# Patient Record
Sex: Male | Born: 1960 | Race: Black or African American | Hispanic: No | Marital: Married | Smoking: Never smoker
Health system: Southern US, Community
[De-identification: ages and names within clinical notes are randomized; demographics above are authoritative.]

---

## 2013-01-13 ENCOUNTER — Encounter: Payer: Self-pay | Admitting: Rheumatology

## 2013-02-04 ENCOUNTER — Encounter: Payer: Self-pay | Admitting: Rheumatology

## 2014-12-14 ENCOUNTER — Emergency Department: Payer: Self-pay | Admitting: Emergency Medicine

## 2014-12-14 LAB — COMPREHENSIVE METABOLIC PANEL
ALK PHOS: 76 U/L
ALT: 37 U/L
AST: 25 U/L (ref 15–37)
Albumin: 3.8 g/dL (ref 3.4–5.0)
Anion Gap: 4 — ABNORMAL LOW (ref 7–16)
BUN: 11 mg/dL (ref 7–18)
Bilirubin,Total: 0.7 mg/dL (ref 0.2–1.0)
CHLORIDE: 105 mmol/L (ref 98–107)
CO2: 31 mmol/L (ref 21–32)
Calcium, Total: 8.5 mg/dL (ref 8.5–10.1)
Creatinine: 1.2 mg/dL (ref 0.60–1.30)
Glucose: 100 mg/dL — ABNORMAL HIGH (ref 65–99)
Osmolality: 279 (ref 275–301)
Potassium: 3.5 mmol/L (ref 3.5–5.1)
Sodium: 140 mmol/L (ref 136–145)
Total Protein: 8 g/dL (ref 6.4–8.2)

## 2014-12-14 LAB — CBC WITH DIFFERENTIAL/PLATELET
BASOS ABS: 0 10*3/uL (ref 0.0–0.1)
Basophil %: 0.3 %
EOS ABS: 0.1 10*3/uL (ref 0.0–0.7)
Eosinophil %: 0.7 %
HCT: 40.5 % (ref 40.0–52.0)
HGB: 13.6 g/dL (ref 13.0–18.0)
LYMPHS PCT: 32.1 %
Lymphocyte #: 2.6 10*3/uL (ref 1.0–3.6)
MCH: 32.9 pg (ref 26.0–34.0)
MCHC: 33.6 g/dL (ref 32.0–36.0)
MCV: 98 fL (ref 80–100)
MONOS PCT: 8.2 %
Monocyte #: 0.7 x10 3/mm (ref 0.2–1.0)
Neutrophil #: 4.7 10*3/uL (ref 1.4–6.5)
Neutrophil %: 58.7 %
Platelet: 260 10*3/uL (ref 150–440)
RBC: 4.14 10*6/uL — ABNORMAL LOW (ref 4.40–5.90)
RDW: 12.5 % (ref 11.5–14.5)
WBC: 8 10*3/uL (ref 3.8–10.6)

## 2014-12-14 LAB — URINALYSIS, COMPLETE
BACTERIA: NONE SEEN
BLOOD: NEGATIVE
Bilirubin,UR: NEGATIVE
Glucose,UR: NEGATIVE mg/dL (ref 0–75)
Ketone: NEGATIVE
Leukocyte Esterase: NEGATIVE
NITRITE: NEGATIVE
PH: 5 (ref 4.5–8.0)
Protein: NEGATIVE
RBC,UR: 1 /HPF (ref 0–5)
SPECIFIC GRAVITY: 1.016 (ref 1.003–1.030)
SQUAMOUS EPITHELIAL: NONE SEEN

## 2014-12-14 LAB — LIPASE, BLOOD: LIPASE: 182 U/L (ref 73–393)

## 2014-12-14 LAB — TROPONIN I

## 2015-06-13 ENCOUNTER — Encounter
Admission: RE | Disposition: A | Discharge status: Home / Self Care | Payer: Self-pay | Source: Ambulatory Visit | Attending: Gastroenterology

## 2015-06-13 ENCOUNTER — Ambulatory Visit: Payer: Federal, State, Local not specified - PPO | Admitting: Anesthesiology

## 2015-06-13 ENCOUNTER — Ambulatory Visit
Admission: RE | Admit: 2015-06-13 | Discharge: 2015-06-13 | Disposition: A | Discharge status: Home / Self Care | Payer: Federal, State, Local not specified - PPO | Source: Ambulatory Visit | Attending: Gastroenterology | Admitting: Gastroenterology

## 2015-06-13 DIAGNOSIS — Z79899 Other long term (current) drug therapy: Secondary | ICD-10-CM | POA: Insufficient documentation

## 2015-06-13 DIAGNOSIS — M549 Dorsalgia, unspecified: Secondary | ICD-10-CM | POA: Insufficient documentation

## 2015-06-13 DIAGNOSIS — I252 Old myocardial infarction: Secondary | ICD-10-CM | POA: Insufficient documentation

## 2015-06-13 DIAGNOSIS — G8929 Other chronic pain: Secondary | ICD-10-CM | POA: Diagnosis not present

## 2015-06-13 DIAGNOSIS — D125 Benign neoplasm of sigmoid colon: Secondary | ICD-10-CM | POA: Diagnosis not present

## 2015-06-13 DIAGNOSIS — K621 Rectal polyp: Secondary | ICD-10-CM | POA: Insufficient documentation

## 2015-06-13 DIAGNOSIS — Z1211 Encounter for screening for malignant neoplasm of colon: Secondary | ICD-10-CM | POA: Diagnosis present

## 2015-06-13 DIAGNOSIS — M778 Other enthesopathies, not elsewhere classified: Secondary | ICD-10-CM | POA: Insufficient documentation

## 2015-06-13 HISTORY — PX: COLONOSCOPY WITH PROPOFOL: SHX5780

## 2015-06-13 SURGERY — COLONOSCOPY WITH PROPOFOL
Anesthesia: General

## 2015-06-13 MED ORDER — LIDOCAINE HCL (CARDIAC) 20 MG/ML IV SOLN
INTRAVENOUS | Status: DC | PRN
  Administered 2015-06-13: 50 mg via INTRAVENOUS

## 2015-06-13 MED ORDER — PROPOFOL 10 MG/ML IV BOLUS
INTRAVENOUS | Status: DC | PRN
  Administered 2015-06-13: 70 mg via INTRAVENOUS
  Administered 2015-06-13: 30 mg via INTRAVENOUS

## 2015-06-13 MED ORDER — MIDAZOLAM HCL 5 MG/5ML IJ SOLN
INTRAMUSCULAR | Status: DC | PRN
Start: 2015-06-13 — End: 2015-06-13
  Administered 2015-06-13: 1 mg via INTRAVENOUS

## 2015-06-13 MED ORDER — SODIUM CHLORIDE 0.9 % IV SOLN: INTRAVENOUS | Status: DC

## 2015-06-13 MED ORDER — SODIUM CHLORIDE 0.9 % IV SOLN
INTRAVENOUS | Status: DC
  Administered 2015-06-13: 1000 mL via INTRAVENOUS

## 2015-06-13 MED ORDER — PROPOFOL INFUSION 10 MG/ML OPTIME
INTRAVENOUS | Status: DC | PRN
  Administered 2015-06-13: 120 ug/kg/min via INTRAVENOUS

## 2015-06-13 NOTE — Discharge Instructions (Signed)

## 2015-06-13 NOTE — H&P (Signed)
  Primary Care Physician:  Idelle Crouch, MD  Pre-Procedure History & Physical: HPI:  Don Mendoza is a 54 y.o. male is here for an colonoscopy.   History reviewed. No pertinent past medical history.  History reviewed. No pertinent past surgical history.  Prior to Admission medications   Medication Sig Start Date End Date Taking? Authorizing Provider  meloxicam (MOBIC) 7.5 MG tablet Take 7.5 mg by mouth daily.   Yes Historical Provider, MD  valACYclovir (VALTREX) 1000 MG tablet Take 1,000 mg by mouth 2 (two) times daily.   Yes Historical Provider, MD    Allergies as of 05/31/2015  . (Not on File)    History reviewed. No pertinent family history.  History   Social History  . Marital Status: Married    Spouse Name: N/A  . Number of Children: N/A  . Years of Education: N/A   Occupational History  . Not on file.   Social History Main Topics  . Smoking status: Never Smoker   . Smokeless tobacco: Not on file  . Alcohol Use: Not on file  . Drug Use: Not on file  . Sexual Activity: Not on file   Other Topics Concern  . Not on file   Social History Narrative  . No narrative on file     Physical Exam: BP 136/74 mmHg  Pulse 90  Temp(Src) 97.5 F (36.4 C) (Tympanic)  Resp 18  Ht 6\' 3"  (1.905 m)  Wt 104.327 kg (230 lb)  BMI 28.75 kg/m2  SpO2 100% General:   Alert,  pleasant and cooperative in NAD Head:  Normocephalic and atraumatic. Neck:  Supple; no masses or thyromegaly. Lungs:  Clear throughout to auscultation.    Heart:  Regular rate and rhythm. Abdomen:  Soft, nontender and nondistended. Normal bowel sounds, without guarding, and without rebound.   Neurologic:  Alert and  oriented x4;  grossly normal neurologically.  Impression/Plan: Don Mendoza is here for an colonoscopy to be performed for screening, avg risk  Risks, benefits, limitations, and alternatives regarding  colonoscopy have been reviewed with the patient.  Questions have been  answered.  All parties agreeable.   Josefine Class, MD  06/13/2015, 8:57 AM

## 2015-06-13 NOTE — Op Note (Signed)
[facility] Patient Name: Don Mendoza Procedure Date: 06/13/2015 8:49 AM MRN: [id number] Account #: [id number] Date of Birth: [birth year] Admit Type: Outpatient Age: 54 Room: [facility] ENDO ROOM 3 Gender: Male Note Status: Finalized Procedure:         Colonoscopy Indications:       Screening for colorectal malignant neoplasm, This is the                     patient's first colonoscopy Patient Profile:   This is a 54 year old male. Providers:         Gerrit Heck. Rayann Heman, MD Referring MD:      Leonie Douglas. Doy Hutching, MD (Referring MD) Medicines:         Propofol per Anesthesia Complications:     No immediate complications. Procedure:         Pre-Anesthesia Assessment:                    - Prior to the procedure, a History and Physical was                     performed, and patient medications, allergies and                     sensitivities were reviewed. The patient's tolerance of                     previous anesthesia was reviewed.                    After obtaining informed consent, the colonoscope was                     passed under direct vision. Throughout the procedure, the                     patient's blood pressure, pulse, and oxygen saturations                     were monitored continuously. The Colonoscope was                     introduced through the anus and advanced to the the                     terminal ileum. The colonoscopy was performed without                     difficulty. The patient tolerated the procedure well. The                     quality of the bowel preparation was excellent. Findings:      The perianal and digital rectal examinations were normal.      A 5 mm polyp was found in the distal sigmoid colon. The polyp was       semi-pedunculated. The polyp was removed with a cold snare. Resection       and retrieval were complete.      Two sessile polyps were found in the rectum. The polyps were 1 to 2 mm       in size.  These polyps were removed with a jumbo cold forceps. Resection       and retrieval were complete.      The exam was otherwise without abnormality on direct and  retroflexion       views. Impression:        - One 5 mm polyp in the distal sigmoid colon. Resected and                     retrieved.                    - Two 1 to 2 mm polyps in the rectum. Resected and                     retrieved.                    - The examination was otherwise normal on direct and                     retroflexion views. Recommendation:    - Observe patient in GI recovery unit.                    - High fiber diet.                    - Continue present medications.                    - Await pathology results.                    - Repeat colonoscopy for surveillance based on pathology                     results.                    - Return to referring physician.                    - The findings and recommendations were discussed with the                     patient.                    - The findings and recommendations were discussed with the                     patient's family. Procedure Code(s): --- Professional ---                    [phone], Colonoscopy, flexible; with removal of tumor(s),                     polyp(s), or other lesion(s) by snare technique                    [id number], 51, Colonoscopy, flexible; with biopsy, single or                     multiple CPT copyright 2014 [employer]. All rights reserved. The codes documented in this report are preliminary and upon coder review may  be revised to meet current compliance requirements. Mellody Life, MD 06/13/2015 9:42:35 AM This report has been signed electronically. Number of Addenda: 0 Note Initiated On: 06/13/2015 8:49 AM Scope Withdrawal Time: 0 hours 17 minutes 37 seconds  Total Procedure Duration: 0 hours 23 minutes 13 seconds       [facility]

## 2015-06-13 NOTE — Anesthesia Preprocedure Evaluation (Signed)
Anesthesia Evaluation  Patient identified by MRN, date of birth, ID band Patient awake    Reviewed: Allergy & Precautions, H&P , NPO status , Patient's Chart, lab work & pertinent test results, reviewed documented beta blocker date and time   Airway Mallampati: III  TM Distance: >3 FB Neck ROM: limited    Dental  (+) Upper Dentures, Lower Dentures   Pulmonary neg pulmonary ROS,  breath sounds clear to auscultation  Pulmonary exam normal       Cardiovascular - Past MI negative cardio ROS Normal cardiovascular examRhythm:regular Rate:Normal     Neuro/Psych negative neurological ROS  negative psych ROS   GI/Hepatic negative GI ROS, Neg liver ROS,   Endo/Other  negative endocrine ROS  Renal/GU negative Renal ROS  negative genitourinary   Musculoskeletal   Abdominal   Peds  Hematology negative hematology ROS (+)   Anesthesia Other Findings   Reproductive/Obstetrics negative OB ROS                             Anesthesia Physical Anesthesia Plan  ASA: III  Anesthesia Plan: General   Post-op Pain Management:    Induction:   Airway Management Planned:   Additional Equipment:   Intra-op Plan:   Post-operative Plan:   Informed Consent: I have reviewed the patients History and Physical, chart, labs and discussed the procedure including the risks, benefits and alternatives for the proposed anesthesia with the patient or authorized representative who has indicated his/her understanding and acceptance.   Dental Advisory Given  Plan Discussed with: Anesthesiologist, CRNA and Surgeon  Anesthesia Plan Comments:         Anesthesia Quick Evaluation

## 2015-06-13 NOTE — Anesthesia Postprocedure Evaluation (Signed)
  Anesthesia Post-op Note  Patient: Don Mendoza  Procedure(s) Performed: Procedure(s): COLONOSCOPY WITH PROPOFOL (N/A)  Anesthesia type:General  Patient location: PACU  Post pain: Pain level controlled  Post assessment: Post-op Vital signs reviewed, Patient's Cardiovascular Status Stable, Respiratory Function Stable, Patent Airway and No signs of Nausea or vomiting  Post vital signs: Reviewed and stable  Last Vitals:  Filed Vitals:   06/13/15 0934  BP: 115/70  Pulse: 71  Temp: 36.4 C  Resp: 22    Level of consciousness: awake, alert  and patient cooperative  Complications: No apparent anesthesia complications

## 2015-06-13 NOTE — Transfer of Care (Signed)
Immediate Anesthesia Transfer of Care Note  Patient: Don Mendoza  Procedure(s) Performed: Procedure(s): COLONOSCOPY WITH PROPOFOL (N/A)  Patient Location: Endoscopy Unit  Anesthesia Type:General  Level of Consciousness: awake  Airway & Oxygen Therapy: Patient Spontanous Breathing and Patient connected to nasal cannula oxygen  Post-op Assessment: Report given to RN  Post vital signs: Reviewed  Last Vitals:  Filed Vitals:   06/13/15 0934  BP: 115/70  Pulse: 74  Temp: 36.4 C  Resp: 16    Complications: No apparent anesthesia complications

## 2015-06-14 ENCOUNTER — Encounter: Payer: Self-pay | Admitting: Gastroenterology

## 2015-06-14 LAB — SURGICAL PATHOLOGY

## 2015-06-14 NOTE — Anesthesia Postprocedure Evaluation (Deleted)
  Anesthesia Post-op Note  Patient: Don Mendoza  Procedure(s) Performed: Procedure(s): COLONOSCOPY WITH PROPOFOL (N/A)  Anesthesia type:General  Patient location: PACU  Post pain: Pain level controlled  Post assessment: Post-op Vital signs reviewed, Patient's Cardiovascular Status Stable, Respiratory Function Stable, Patent Airway and No signs of Nausea or vomiting  Post vital signs: Reviewed and stable  Last Vitals:  Filed Vitals:   06/13/15 1000  BP: 115/69  Pulse:   Temp:   Resp:     Level of consciousness: awake, alert  and patient cooperative  Complications: No apparent anesthesia complications

## 2017-04-27 ENCOUNTER — Other Ambulatory Visit: Payer: Self-pay | Admitting: Internal Medicine

## 2017-04-27 DIAGNOSIS — R1012 Left upper quadrant pain: Secondary | ICD-10-CM

## 2017-05-07 ENCOUNTER — Ambulatory Visit
Admission: RE | Admit: 2017-05-07 | Discharge: 2017-05-07 | Disposition: A | Discharge status: Home / Self Care | Payer: Federal, State, Local not specified - PPO | Source: Ambulatory Visit | Attending: Internal Medicine | Admitting: Internal Medicine

## 2017-05-07 DIAGNOSIS — R1012 Left upper quadrant pain: Secondary | ICD-10-CM | POA: Insufficient documentation

## 2018-09-14 ENCOUNTER — Other Ambulatory Visit: Payer: Self-pay | Admitting: Internal Medicine

## 2018-09-14 DIAGNOSIS — M5442 Lumbago with sciatica, left side: Principal | ICD-10-CM

## 2018-09-14 DIAGNOSIS — G8929 Other chronic pain: Secondary | ICD-10-CM

## 2018-09-14 DIAGNOSIS — M5441 Lumbago with sciatica, right side: Principal | ICD-10-CM

## 2018-10-04 ENCOUNTER — Ambulatory Visit
Admission: RE | Admit: 2018-10-04 | Discharge: 2018-10-04 | Disposition: A | Discharge status: Home / Self Care | Payer: Federal, State, Local not specified - PPO | Source: Ambulatory Visit | Attending: Internal Medicine | Admitting: Internal Medicine

## 2018-10-04 DIAGNOSIS — M5441 Lumbago with sciatica, right side: Secondary | ICD-10-CM

## 2018-10-04 DIAGNOSIS — G8929 Other chronic pain: Secondary | ICD-10-CM | POA: Diagnosis not present

## 2018-10-04 DIAGNOSIS — M5442 Lumbago with sciatica, left side: Secondary | ICD-10-CM

## 2018-10-04 DIAGNOSIS — M5116 Intervertebral disc disorders with radiculopathy, lumbar region: Secondary | ICD-10-CM | POA: Diagnosis not present

## 2020-05-15 IMAGING — MR MR LUMBAR SPINE W/O CM
5 series · 32 of 48 positions shown · non-contrast
Comparison: None.

CLINICAL DATA: Chronic low back and bilateral leg pain.

EXAM:
MRI LUMBAR SPINE WITHOUT CONTRAST
TECHNIQUE: Multiplanar, multisequence MR imaging of the lumbar spine was
performed. No intravenous contrast was administered.

[Series 5: T2 · sagittal · 4.0mm · 0.81mm/px · 7 of 15 slices shown (1 of 2)]
[im 1/15]
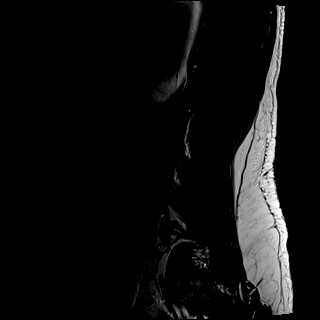
[im 3/15]
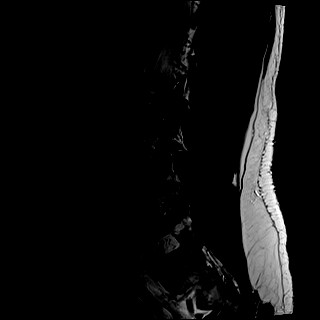
[im 5/15]
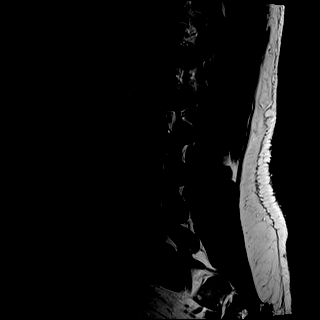
[im 8/15]
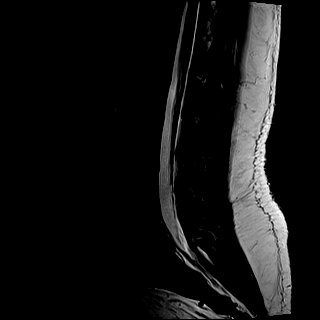
[im 10/15]
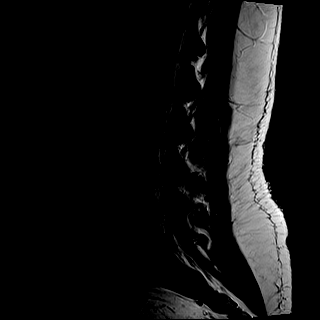
[im 12/15]
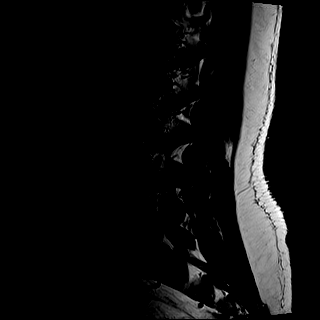
[im 15/15]
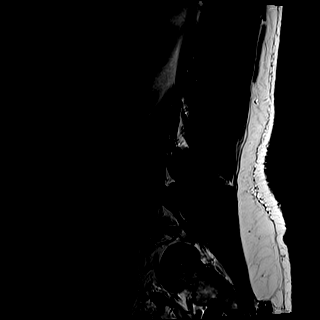

[Series 6: T1 · sagittal · 4.0mm · 0.81mm/px · 7 of 15 slices shown (1 of 2)]
[im 1/15]
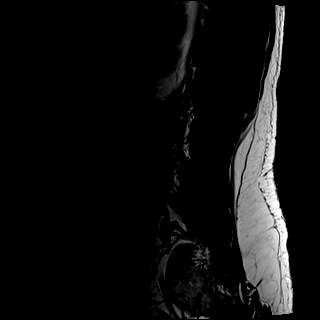
[im 3/15]
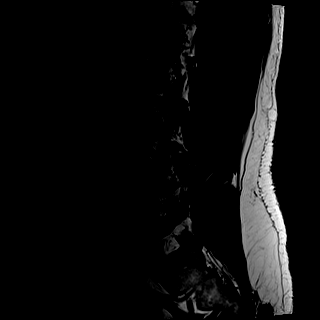
[im 5/15]
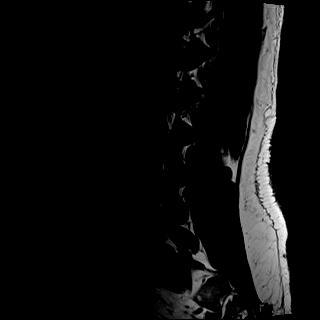
[im 8/15]
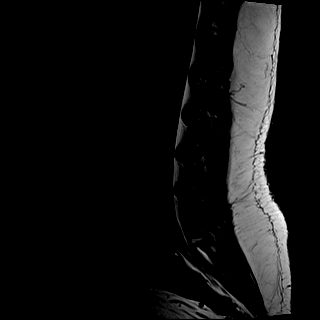
[im 10/15]
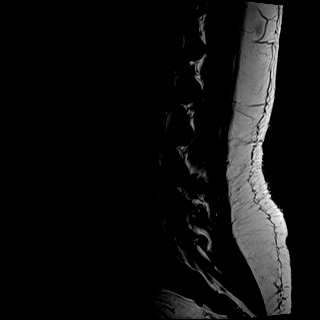
[im 12/15]
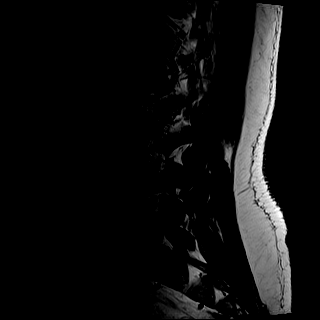
[im 15/15]
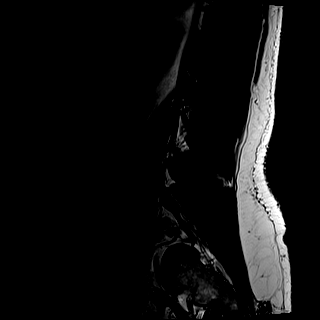

[Series 7: STIR · sagittal · 4.0mm · 0.41mm/px · 2 of 15 slices shown]
[im 1/15]
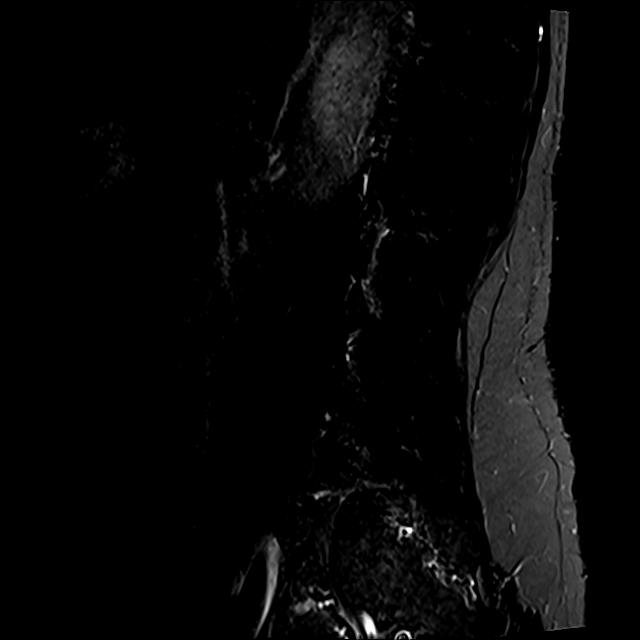
[im 3/15]
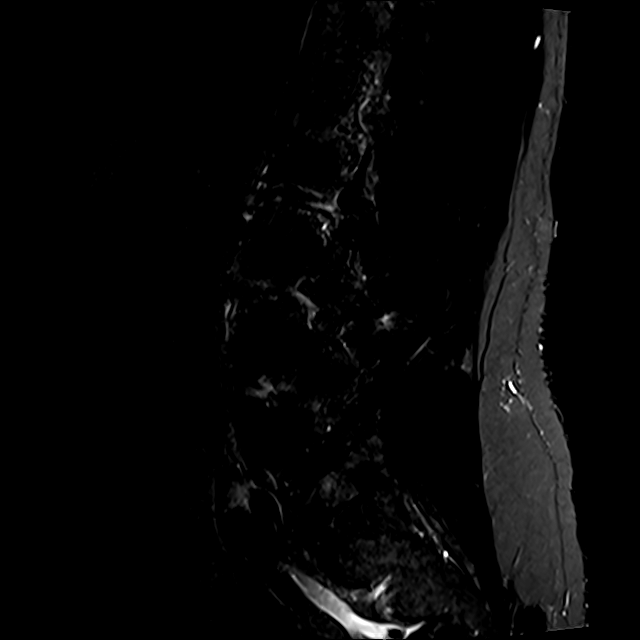

[Series 8: T2 · axial · 4.0mm · 0.78mm/px · z∈[-13,+188]mm · 8 of 34 slices shown (2 of 2)]
[im 1/34]
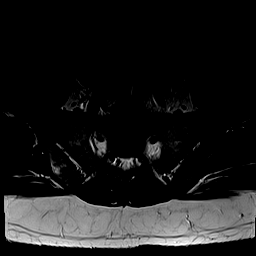
[im 6/34]
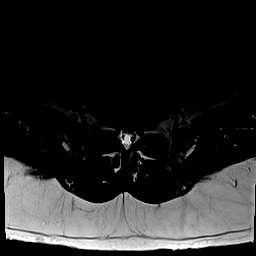
[im 11/34]
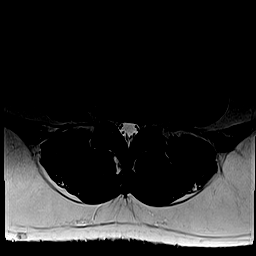
[im 16/34]
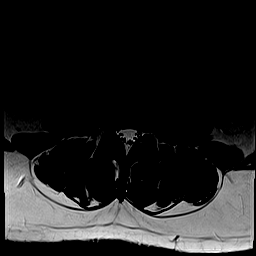
[im 18/34]
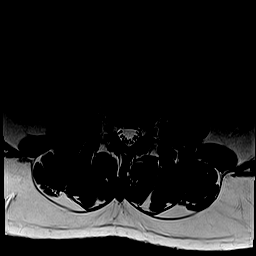
[im 23/34]
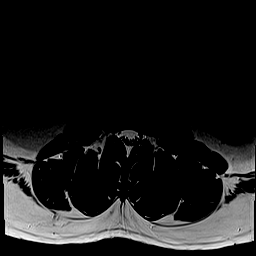
[im 28/34]
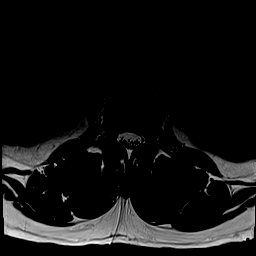
[im 34/34]
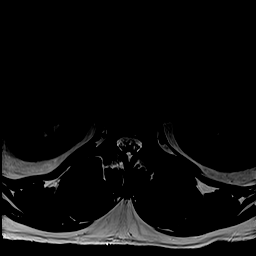

[Series 10: T1 · axial · 4.0mm · 0.39mm/px · z∈[-13,+188]mm · 8 of 34 slices shown (2 of 2)]
[im 1/34]
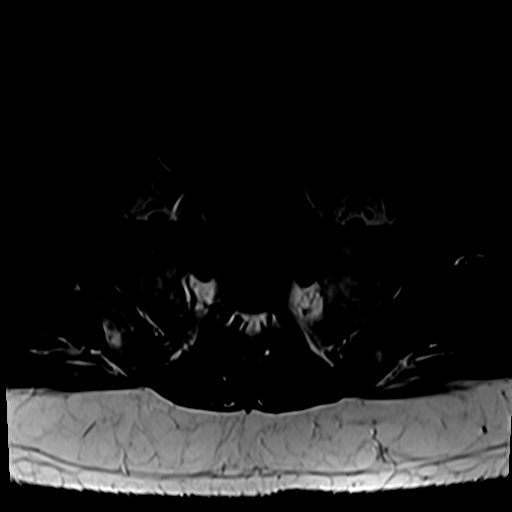
[im 6/34]
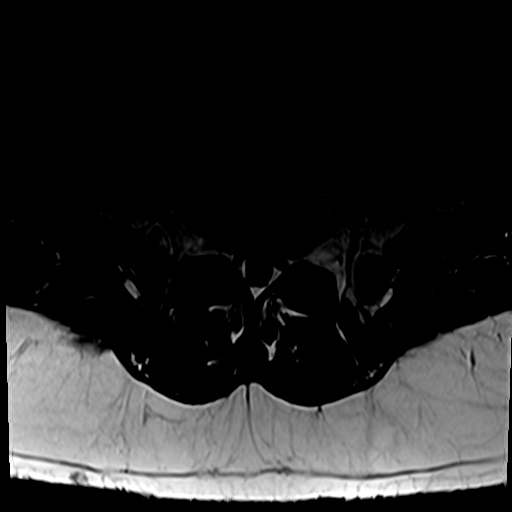
[im 11/34]
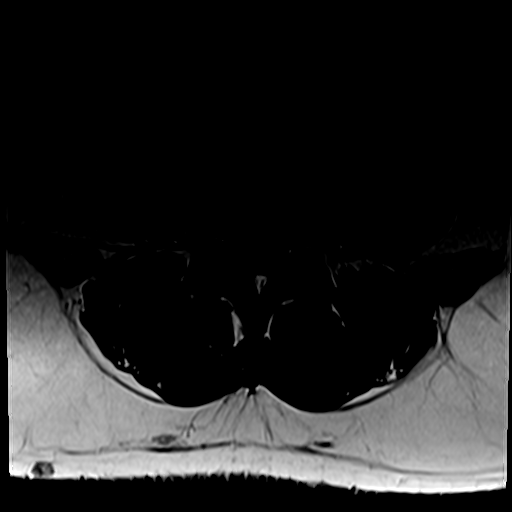
[im 16/34]
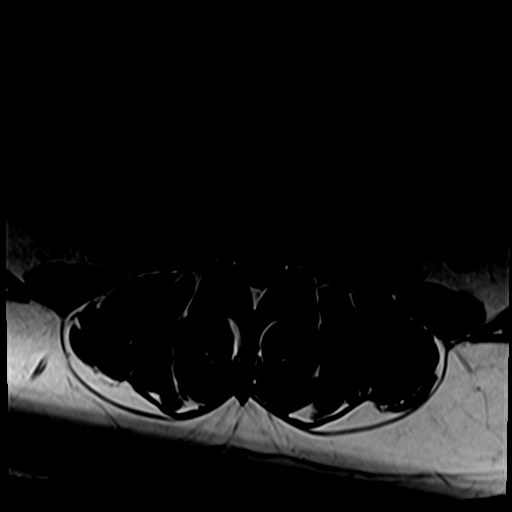
[im 18/34]
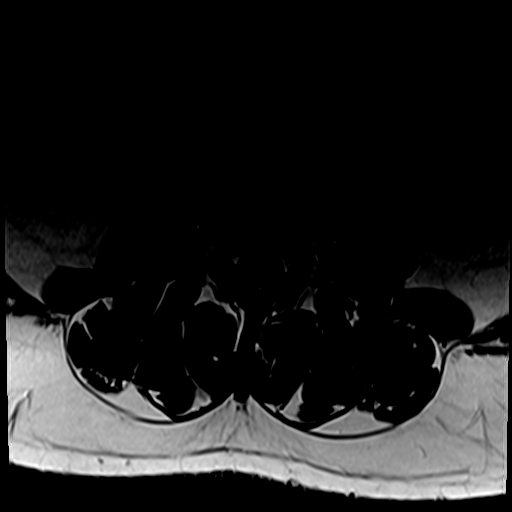
[im 23/34]
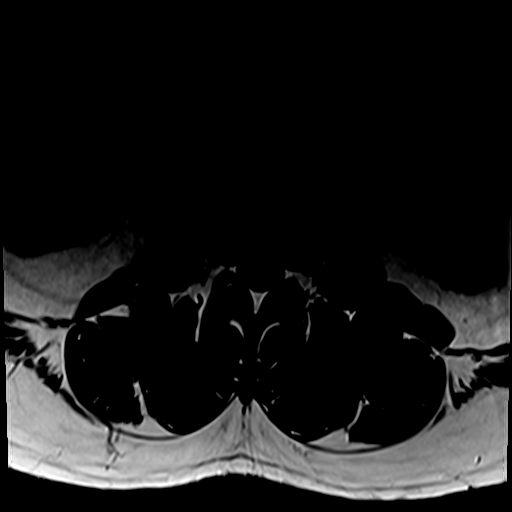
[im 28/34]
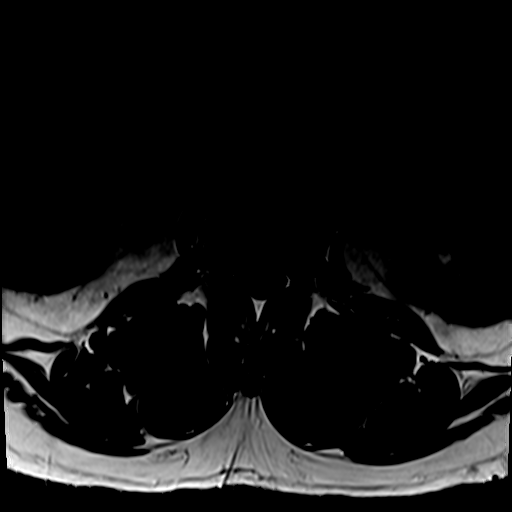
[im 34/34]
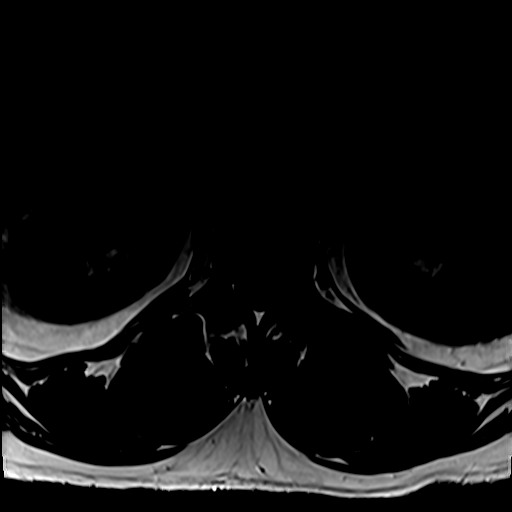

[32 of 48 positions shown; findings below may reference images not displayed]

FINDINGS: Segmentation: Assumed standard. The last well-formed disc space is
designated L5-S1 for the purposes of this report.

Alignment:  Physiologic.

Vertebrae:  No fracture, evidence of discitis, or bone lesion.

Conus medullaris and cauda equina: Conus extends to the L1-L2 level.
Conus and cauda equina appear normal.

Paraspinal and other soft tissues: Negative.

Disc levels:

T12-L1:  Negative.

L1-L2: Minimal disc bulge. Tiny left paracentral disc protrusion. No
stenosis.

L2-L3:  Minimal disc bulge.  No stenosis.

L3-L4: Mild disc bulge encroaching on the exiting right L3 nerve
root in the far lateral zone. Mild right neuroforaminal stenosis. No
spinal canal or left neuroforaminal stenosis.

L4-L5:  Minimal disc bulge.  No stenosis.

L5-S1:  Negative.
IMPRESSION: 1. Very mild lumbar degenerative disc disease as described above.
Disc bulge at L3-L4 encroaches on and potentially irritates the
exiting right L3 nerve root in the far lateral zone.

## 2023-10-13 ENCOUNTER — Telehealth: Payer: Self-pay | Admitting: Internal Medicine

## 2023-10-13 NOTE — Telephone Encounter (Signed)
Ok to do this/thanks 

## 2023-10-13 NOTE — Telephone Encounter (Signed)
Patient's wife, Porfirio Mylar is a current patient of Dr. Alphonsus Sias. Pt's wife has requested to transfer care from Dr. Alphonsus Sias to Dr. Reece Agar. Patient's wife also wanted to see if Dr. Reece Agar would accept Jerolyn Shin as a new patient. Patient's wife would like for both to be established under same provider. Please advise wife with response.

## 2023-10-13 NOTE — Telephone Encounter (Signed)
Lvmtcb with pt's wife, carmen.
